# Patient Record
Sex: Female | Born: 1970 | Race: White | Hispanic: No | Marital: Married | Smoking: Never smoker
Health system: Southern US, Community
[De-identification: ages and names within clinical notes are randomized; demographics above are authoritative.]

## PROBLEM LIST (undated history)

## (undated) DIAGNOSIS — E079 Disorder of thyroid, unspecified: Secondary | ICD-10-CM

## (undated) DIAGNOSIS — N979 Female infertility, unspecified: Secondary | ICD-10-CM

## (undated) DIAGNOSIS — M8580 Other specified disorders of bone density and structure, unspecified site: Secondary | ICD-10-CM

## (undated) DIAGNOSIS — K52832 Lymphocytic colitis: Secondary | ICD-10-CM

## (undated) DIAGNOSIS — F419 Anxiety disorder, unspecified: Secondary | ICD-10-CM

## (undated) DIAGNOSIS — F32A Depression, unspecified: Secondary | ICD-10-CM

## (undated) HISTORY — DX: Lymphocytic colitis: K52.832

## (undated) HISTORY — DX: Anxiety disorder, unspecified: F41.9

## (undated) HISTORY — DX: Female infertility, unspecified: N97.9

## (undated) HISTORY — DX: Disorder of thyroid, unspecified: E07.9

## (undated) HISTORY — DX: Depression, unspecified: F32.A

## (undated) HISTORY — DX: Other specified disorders of bone density and structure, unspecified site: M85.80

---

## 2000-05-17 ENCOUNTER — Other Ambulatory Visit: Admission: RE | Admit: 2000-05-17 | Discharge: 2000-05-17 | Payer: Self-pay | Admitting: *Deleted

## 2001-09-20 ENCOUNTER — Other Ambulatory Visit: Admission: RE | Admit: 2001-09-20 | Discharge: 2001-09-20 | Payer: Self-pay | Admitting: Obstetrics and Gynecology

## 2004-05-13 ENCOUNTER — Other Ambulatory Visit: Admission: RE | Admit: 2004-05-13 | Discharge: 2004-05-13 | Payer: Self-pay | Admitting: Obstetrics and Gynecology

## 2004-12-15 ENCOUNTER — Emergency Department (HOSPITAL_COMMUNITY): Admission: EM | Admit: 2004-12-15 | Discharge: 2004-12-15 | Payer: Self-pay | Admitting: Emergency Medicine

## 2005-05-20 ENCOUNTER — Other Ambulatory Visit: Admission: RE | Admit: 2005-05-20 | Discharge: 2005-05-20 | Payer: Self-pay | Admitting: Obstetrics and Gynecology

## 2005-05-26 ENCOUNTER — Encounter: Admission: RE | Admit: 2005-05-26 | Discharge: 2005-05-26 | Payer: Self-pay | Admitting: Obstetrics and Gynecology

## 2006-05-06 ENCOUNTER — Ambulatory Visit (HOSPITAL_COMMUNITY): Admission: RE | Admit: 2006-05-06 | Discharge: 2006-05-06 | Payer: Self-pay | Admitting: Obstetrics & Gynecology

## 2007-01-25 ENCOUNTER — Ambulatory Visit (HOSPITAL_COMMUNITY): Admission: RE | Admit: 2007-01-25 | Discharge: 2007-01-25 | Payer: Self-pay | Admitting: Gynecology

## 2007-05-30 ENCOUNTER — Inpatient Hospital Stay (HOSPITAL_COMMUNITY): Admission: AD | Admit: 2007-05-30 | Discharge: 2007-05-30 | Payer: Self-pay | Admitting: Obstetrics and Gynecology

## 2007-10-20 ENCOUNTER — Inpatient Hospital Stay (HOSPITAL_COMMUNITY): Admission: AD | Admit: 2007-10-20 | Discharge: 2007-10-20 | Payer: Self-pay | Admitting: Obstetrics and Gynecology

## 2007-10-26 ENCOUNTER — Inpatient Hospital Stay (HOSPITAL_COMMUNITY): Admission: AD | Admit: 2007-10-26 | Discharge: 2007-10-28 | Payer: Self-pay | Admitting: Obstetrics and Gynecology

## 2010-03-06 ENCOUNTER — Inpatient Hospital Stay (HOSPITAL_COMMUNITY): Admission: AD | Admit: 2010-03-06 | Discharge: 2010-03-06 | Payer: Self-pay | Admitting: Obstetrics and Gynecology

## 2010-03-17 ENCOUNTER — Inpatient Hospital Stay (HOSPITAL_COMMUNITY)
Admission: AD | Admit: 2010-03-17 | Discharge: 2010-03-17 | Payer: Self-pay | Source: Home / Self Care | Admitting: Obstetrics & Gynecology

## 2010-04-17 ENCOUNTER — Inpatient Hospital Stay (HOSPITAL_COMMUNITY): Admission: AD | Admit: 2010-04-17 | Discharge: 2010-04-19 | Payer: Self-pay | Admitting: Obstetrics and Gynecology

## 2010-04-19 ENCOUNTER — Encounter: Admission: RE | Admit: 2010-04-19 | Discharge: 2010-05-19 | Payer: Self-pay | Admitting: Obstetrics and Gynecology

## 2010-05-20 ENCOUNTER — Encounter: Admission: RE | Admit: 2010-05-20 | Discharge: 2010-06-19 | Payer: Self-pay | Admitting: Obstetrics and Gynecology

## 2010-06-20 ENCOUNTER — Encounter: Admission: RE | Admit: 2010-06-20 | Discharge: 2010-07-20 | Payer: Self-pay | Admitting: Obstetrics and Gynecology

## 2010-07-21 ENCOUNTER — Encounter: Admission: RE | Admit: 2010-07-21 | Discharge: 2010-08-18 | Payer: Self-pay | Admitting: Obstetrics and Gynecology

## 2010-08-21 ENCOUNTER — Encounter
Admission: RE | Admit: 2010-08-21 | Discharge: 2010-09-20 | Payer: Self-pay | Source: Home / Self Care | Admitting: Obstetrics and Gynecology

## 2010-09-21 ENCOUNTER — Encounter
Admission: RE | Admit: 2010-09-21 | Discharge: 2010-10-21 | Payer: Self-pay | Source: Home / Self Care | Admitting: Obstetrics and Gynecology

## 2011-02-08 LAB — CBC
HCT: 31.3 % — ABNORMAL LOW (ref 36.0–46.0)
HCT: 36.2 % (ref 36.0–46.0)
Hemoglobin: 10.9 g/dL — ABNORMAL LOW (ref 12.0–15.0)
Hemoglobin: 12.8 g/dL (ref 12.0–15.0)
MCHC: 35 g/dL (ref 30.0–36.0)
MCHC: 35.5 g/dL (ref 30.0–36.0)
Platelets: 205 10*3/uL (ref 150–400)
RBC: 3.41 MIL/uL — ABNORMAL LOW (ref 3.87–5.11)
RDW: 13.6 % (ref 11.5–15.5)
WBC: 14.6 10*3/uL — ABNORMAL HIGH (ref 4.0–10.5)

## 2011-08-30 LAB — CBC
MCHC: 35
MCHC: 35.1
Platelets: 249
RBC: 3.34 — ABNORMAL LOW
RBC: 4.29
RDW: 13.3
RDW: 13.3

## 2011-08-30 LAB — RPR: RPR Ser Ql: NONREACTIVE

## 2011-08-31 LAB — URINALYSIS, DIPSTICK ONLY
Bilirubin Urine: NEGATIVE
Glucose, UA: NEGATIVE
Leukocytes, UA: NEGATIVE
Nitrite: NEGATIVE
Urobilinogen, UA: 0.2

## 2011-09-07 LAB — URINALYSIS, ROUTINE W REFLEX MICROSCOPIC
Bilirubin Urine: NEGATIVE
Glucose, UA: NEGATIVE
Protein, ur: NEGATIVE
Specific Gravity, Urine: <1.005 — ABNORMAL LOW
pH: 6

## 2015-04-04 ENCOUNTER — Ambulatory Visit (INDEPENDENT_AMBULATORY_CARE_PROVIDER_SITE_OTHER): Payer: BC Managed Care – PPO

## 2015-04-04 ENCOUNTER — Ambulatory Visit (INDEPENDENT_AMBULATORY_CARE_PROVIDER_SITE_OTHER): Payer: BC Managed Care – PPO | Admitting: Family Medicine

## 2015-04-04 VITALS — BP 124/76 | HR 78 | Temp 98.2°F | Resp 17 | Ht 64.5 in | Wt 133.0 lb

## 2015-04-04 DIAGNOSIS — M79672 Pain in left foot: Secondary | ICD-10-CM | POA: Diagnosis not present

## 2015-04-04 DIAGNOSIS — S92355A Nondisplaced fracture of fifth metatarsal bone, left foot, initial encounter for closed fracture: Secondary | ICD-10-CM

## 2015-04-04 MED ORDER — HYDROCODONE-ACETAMINOPHEN 5-325 MG PO TABS: 1.0000 | ORAL_TABLET | Freq: Three times a day (TID) | ORAL | Status: DC | PRN

## 2015-04-04 NOTE — Progress Notes (Signed)
[facility] [address], [place] [id number] [phone]  Date:  04/04/2015   Name:  Sharon English   DOB:  May 22, 1971   MRN:  [id number]  PCP:  No primary care provider on file.    Chief Complaint: Foot Pain   History of Present Illness:  Sharon English is a 44 y.o. very pleasant female patient who presents with the following:  Here today as a new patient. She stood up yesterday after sitting for a long time- did not realize that her left foot was numb from being sat on, the foot rolled under her and inverted.  She had pain and "a wave of nausea."  She noted onset of swelling right away, and it bruised overnight. Here today to make sure no fracture She is wearing a tall CAM that she had at home and is using a crutch that she had.  She thinks she has the other crutch in her garage Hurts to bear weight even with these measures She is generally in good health  LMP was approx 25 days ago- she is just starting the inactive OCP in her current pack  There are no active problems to display for this patient.   No past medical history on file.  No past surgical history on file.  History  Substance Use Topics  . Smoking status: Never Smoker   . Smokeless tobacco: Not on file  . Alcohol Use: Not on file    No family history on file.  Allergies  Allergen Reactions  . Penicillins Rash  . Sulfa Antibiotics Rash    Medication list has been reviewed and updated.  No current outpatient prescriptions on file prior to visit.   No current facility-administered medications on file prior to visit.    Review of Systems:  As per HPI- otherwise negative.   Physical Examination: Filed Vitals:   04/04/15 0905  BP: 124/76  Pulse: 78  Temp: 98.2 F (36.8 C)  Resp: 17   Filed Vitals:   04/04/15 0905  Height: 5' 4.5" (1.638 m)  Weight: 133 lb (60.328 kg)   Body mass index is 22.48 kg/(m^2). Ideal Body Weight: Weight in (lb) to have BMI = 25:  147.6  GEN: WDWN, NAD, Non-toxic, A & O x 3, looks well HEENT: Atraumatic, Normocephalic. Neck supple. No masses, No LAD. Ears and Nose: No external deformity. CV: RRR, No M/G/R. No JVD. No thrill. No extra heart sounds. PULM: CTA B, no wheezes, crackles, rhonchi. No retractions. No resp. distress. No accessory muscle use. EXTR: No c/c/e NEURO using crutches and tall CAM boot to ambulate PSYCH: Normally interactive. Conversant. Not depressed or anxious appearing.  Calm demeanor.  Left foot: bruising and swelling over lateral foot.  Ankle is negative, tibia negative, achilles in intact.  Tender over the proximal 5th MT  [facility] reading (PRIMARY) by  Dr. Patsy Lageropland. Left foot:  Non- displaced 5th MT base fracture  LEFT FOOT - COMPLETE 3+ VIEW  COMPARISON: None.  FINDINGS: Fractured base of left fifth metatarsal noted. Minimal displacement. No other focal abnormality. No radiopaque foreign body.  IMPRESSION: Minimally displaced fracture of the base of the left fifth metatarsal. Assessment and Plan: Nondisplaced fracture of fifth left metatarsal bone, closed, initial encounter - Plan: Ambulatory referral to Orthopedic Surgery  Acute pain of left foot - Plan: DG Foot Complete Left, HYDROcodone-acetaminophen (NORCO/VICODIN) 5-325 MG per tablet   She has crutches at home,  Placed in a short CAM boot  Referral to ortho for follow-up Hydrocodone if needed for more severe pain- she hopes she will not need to use this  Signed Abbe AmsterdamJessica Marjorie Lussier, MD

## 2015-04-04 NOTE — Patient Instructions (Addendum)
You have a firth metatarsal fracture (break of the fifth foot bone).  I think that this will heal well. I will refer you to orthopedics to follow-up.  Elevate and ice your foot, and certainly use ibuprofen/ tylenol as needed You can use the hydrocodone if needed for more severe pain.  Use the boot and crutches to help yourself walk- do not bear weight on the foot when it is painful!   Please let us know if you do not hear about your orthopedics appointment or if you have any other concerns.

## 2015-12-18 ENCOUNTER — Ambulatory Visit
Admission: RE | Admit: 2015-12-18 | Discharge: 2015-12-18 | Disposition: A | Discharge status: Home / Self Care | Payer: BC Managed Care – PPO | Source: Ambulatory Visit | Attending: Chiropractic Medicine | Admitting: Chiropractic Medicine

## 2015-12-18 ENCOUNTER — Other Ambulatory Visit: Payer: Self-pay | Admitting: Chiropractic Medicine

## 2015-12-18 DIAGNOSIS — W19XXXA Unspecified fall, initial encounter: Secondary | ICD-10-CM

## 2015-12-19 ENCOUNTER — Ambulatory Visit
Admission: RE | Admit: 2015-12-19 | Discharge: 2015-12-19 | Disposition: A | Discharge status: Home / Self Care | Payer: BC Managed Care – PPO | Source: Ambulatory Visit | Attending: Chiropractic Medicine | Admitting: Chiropractic Medicine

## 2015-12-19 ENCOUNTER — Other Ambulatory Visit: Payer: Self-pay | Admitting: Chiropractic Medicine

## 2015-12-19 DIAGNOSIS — M25552 Pain in left hip: Secondary | ICD-10-CM

## 2016-02-27 LAB — HM MAMMOGRAPHY

## 2017-02-11 ENCOUNTER — Other Ambulatory Visit: Payer: Self-pay | Admitting: Obstetrics and Gynecology

## 2017-02-11 DIAGNOSIS — N6320 Unspecified lump in the left breast, unspecified quadrant: Secondary | ICD-10-CM

## 2017-03-18 LAB — HM MAMMOGRAPHY

## 2017-05-22 IMAGING — CR DG HIP (WITH OR WITHOUT PELVIS) 2-3V*L*
2 series · 2 of 2 positions shown · non-contrast
Comparison: None.

CLINICAL DATA: Sledding accident 3 weeks ago. Persistent back pain
and left hip pain.

EXAM:
LUMBAR SPINE - COMPLETE 4+ VIEW; DG HIP (WITH OR WITHOUT PELVIS)
2-3V LEFT

[w hip ap left]
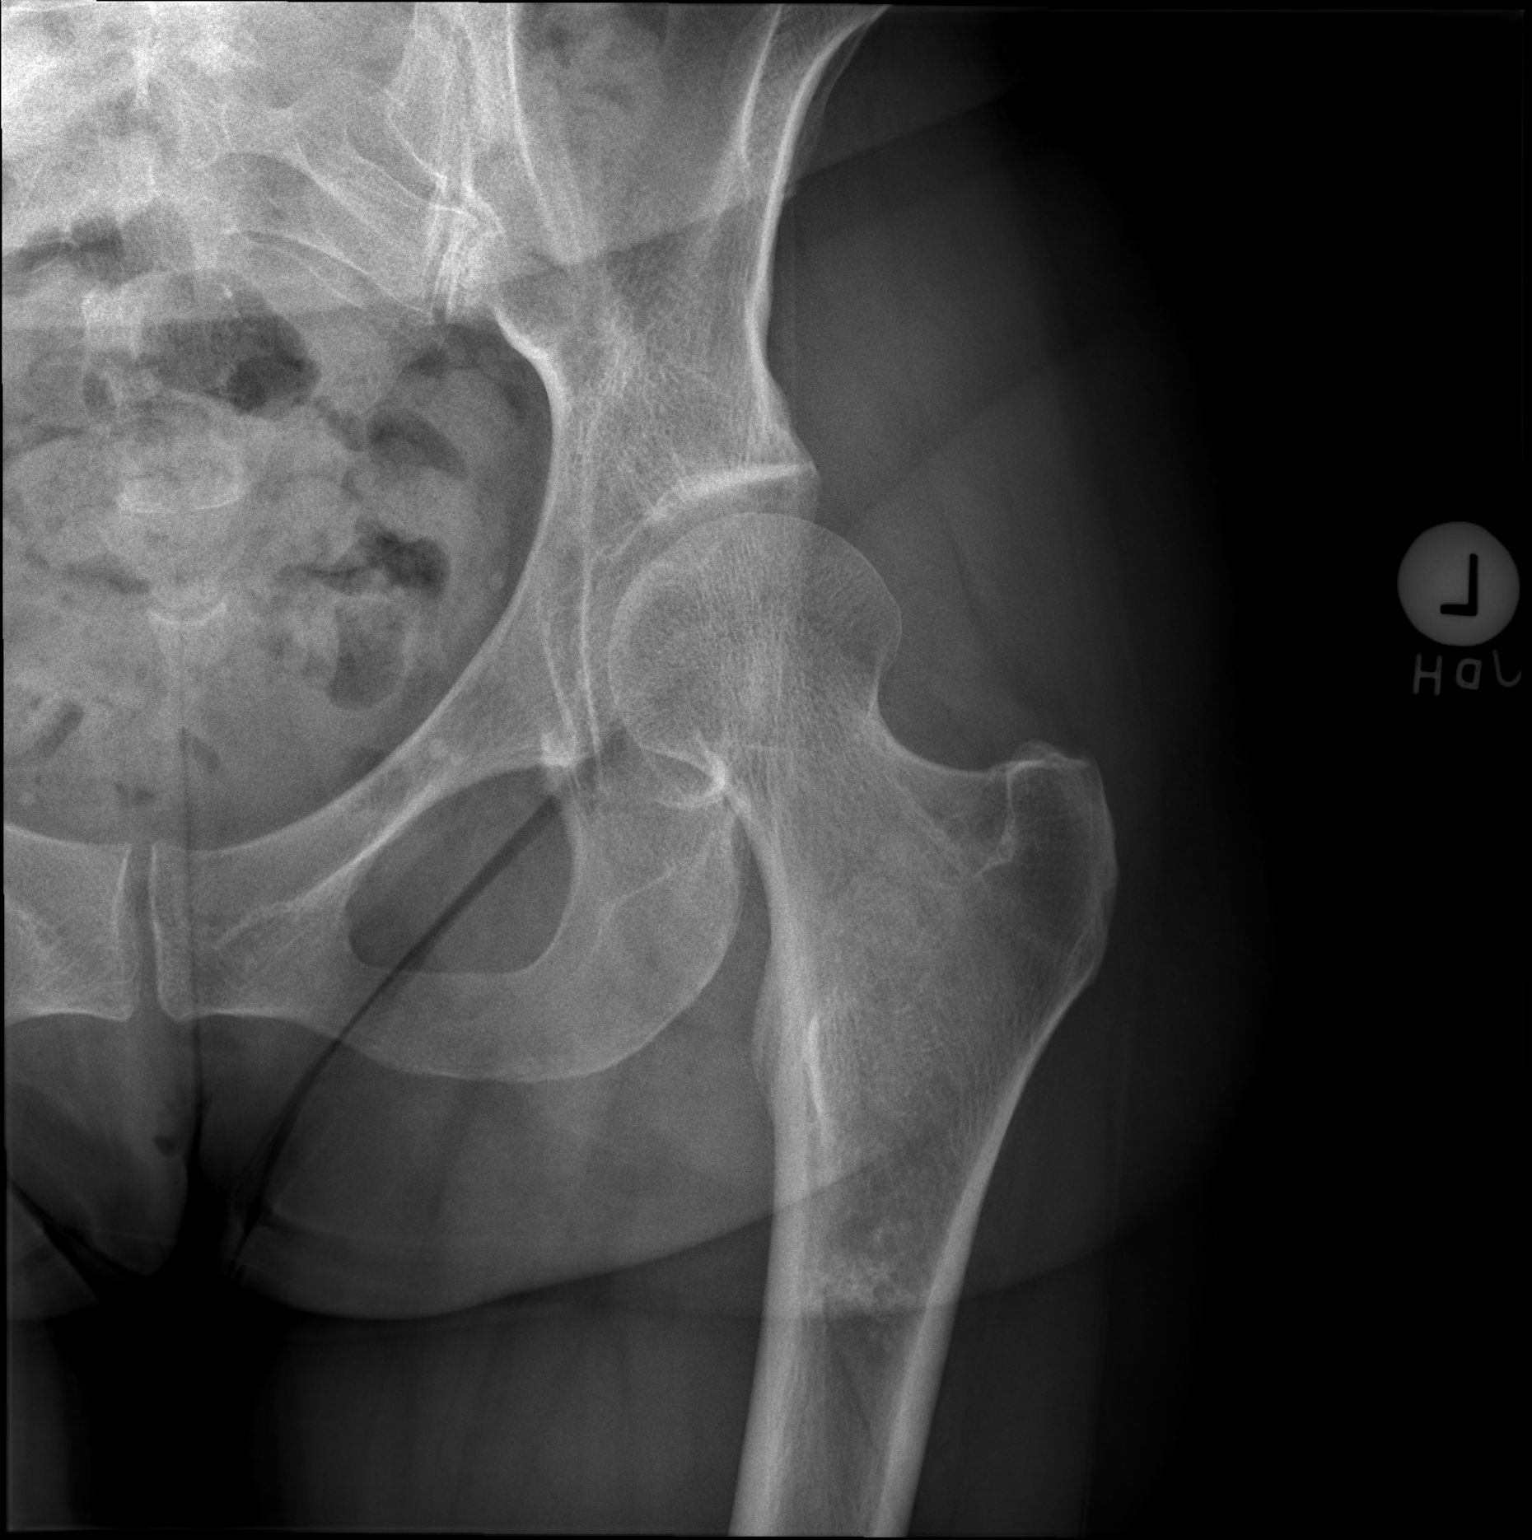

[w hip lat left]
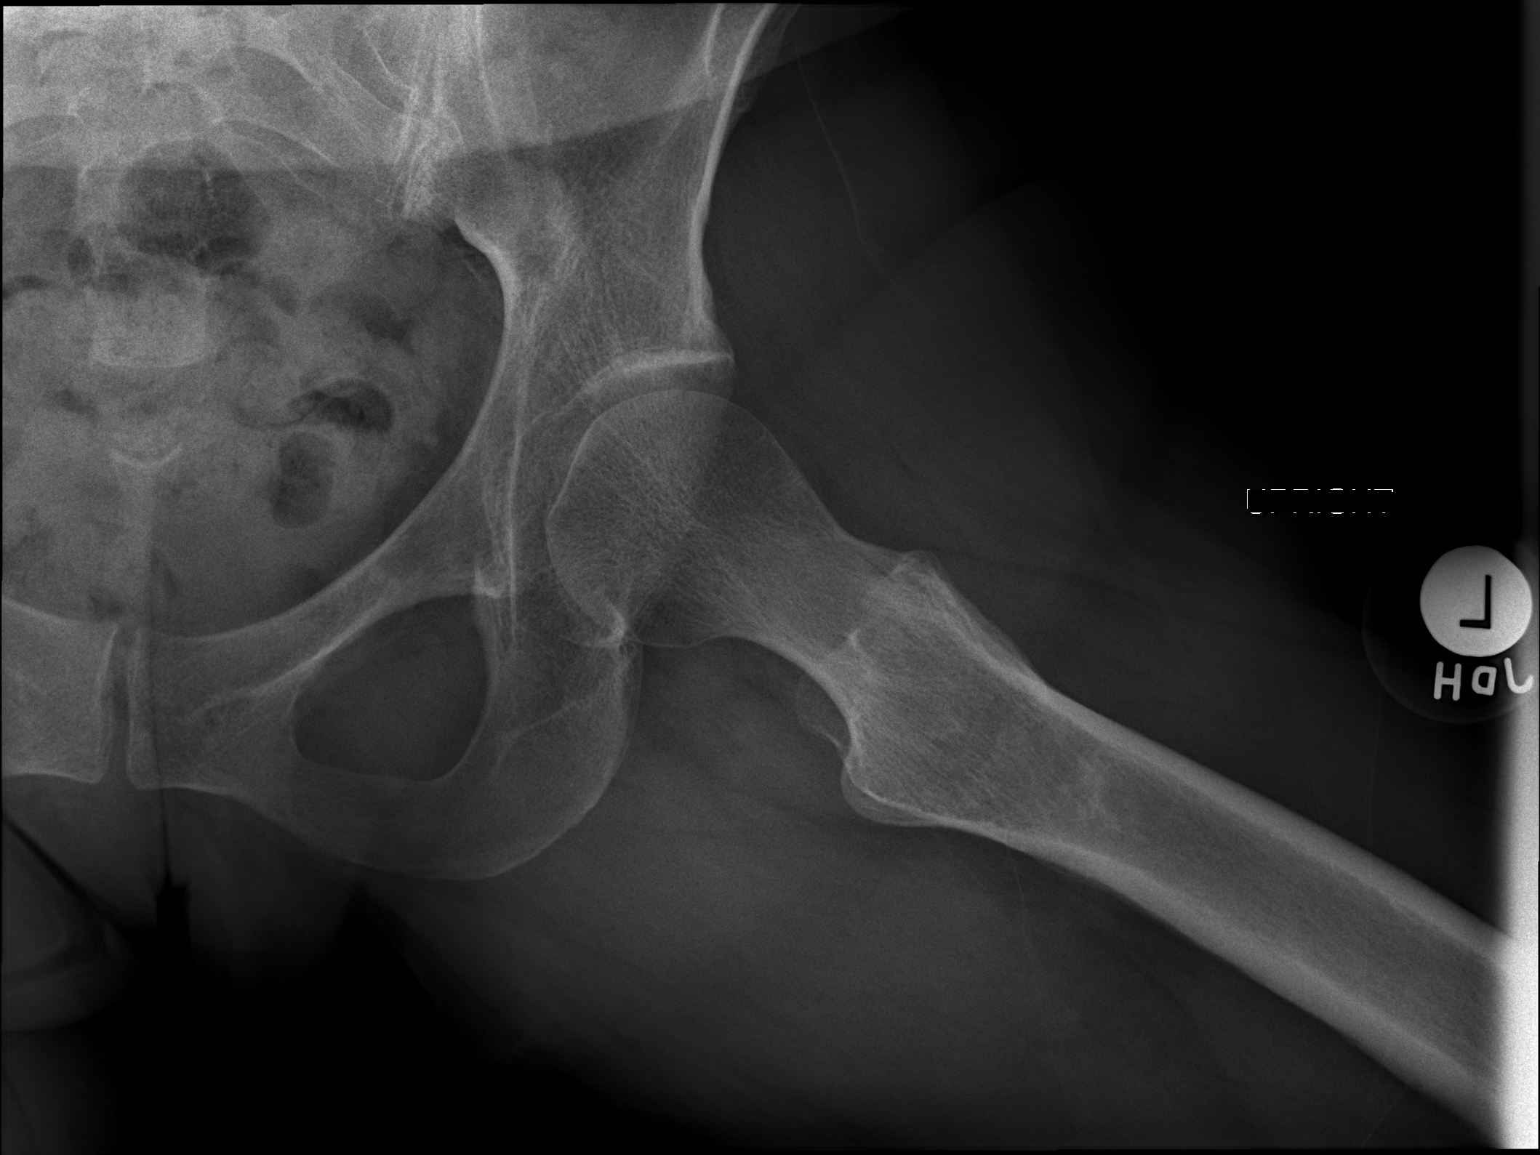

[2 of 2 positions shown; findings below may reference images not displayed]

FINDINGS: Lumbar spine:

Normal alignment of the lumbar vertebral bodies. Disc spaces and
vertebral bodies are maintained. The facets are normally aligned. No
pars defects. The visualized bony pelvis is intact.

Left hip:

The left hip is normally located. No acute fracture or plain film
evidence of avascular necrosis. The pubic symphysis and left SI
joint appear normal. Small faintly sclerotic lesion in the
subtrochanteric region of the left femur is likely a small bone
infarct or possibly enchondroma. There is a linear lucent line
traversing the upper femoral shaft on the frontal film which I do
not see on the lateral film. I think it is unlikely this is a
fracture but dedicated femur views may be helpful.
IMPRESSION: 1. Normal alignment of the lumbar vertebral bodies and no acute
fracture.
2. Linear lucent line traversing the upper femoral shaft on the
frontal film only. This is unlikely a fracture but dedicated femur
diffuse may be helpful for further evaluation. No hip fracture.

## 2018-07-21 LAB — HM MAMMOGRAPHY

## 2019-08-31 LAB — HM MAMMOGRAPHY

## 2020-10-29 LAB — HM MAMMOGRAPHY

## 2020-12-05 ENCOUNTER — Ambulatory Visit (INDEPENDENT_AMBULATORY_CARE_PROVIDER_SITE_OTHER): Payer: Managed Care, Other (non HMO)

## 2020-12-05 ENCOUNTER — Other Ambulatory Visit: Payer: Self-pay

## 2020-12-05 DIAGNOSIS — Z23 Encounter for immunization: Secondary | ICD-10-CM

## 2021-02-11 NOTE — Progress Notes (Signed)
Cardiology Office Note:   Date:  02/12/2021  NAME:  Sharon English    MRN: [id number] DOB:  December 07, 1970   PCP:  Clovis Riley, L.August Saucer, MD  Cardiologist:  No primary care provider on file.  Electrophysiologist:  None   Referring MD: Clovis Riley, L.August Saucer, MD   Chief Complaint  Patient presents with  . Chest Pain   History of Present Illness:   Sharon English is a 50 y.o. female with a hx of thyroid disease who is being seen today for the evaluation of chest pain at the request of Clovis Riley, L.August Saucer, MD.  She presents with 2 episodes of chest pain.  She reports she had an episode 2 weeks ago.  Apparently this occurred on Sunday.  She reports he was driving home from church.  She felt pain in the center of her chest that lasted 3 to 5 minutes.  Symptoms were associated without a trigger.  She reports they resolved with rest and relaxation.  She reports she laid down on the improved.  She does report that she was a bit dizzy and lightheaded with it.  She had a similar episode roughly 1 month ago.  This occurred while laying down to go to bed.  She reports that her aunt has died recently.  There has been a lot of stress in the house.  She does suffer from anxiety depression and sees a Veterinary surgeon.  Symptoms appear to be stable.  She reports no increased stress other than the fact that her aunt did die.  She reports she exercises twice weekly.  She walks her dog.  Her blood pressure is elevated 150/92.  She has no history of hypertension.  Apparently she was a bit stressed about this appointment.  She has no medical problems.  Most recent lipid profile shows a total cholesterol of 213, HDL 54, LDL 141, triglycerides 104.  She is not diabetic.  Her brother had a heart attack at 107.  He was a type I diabetic.  He apparently smoked as well and was obese.  Her parents do not have histories of heart disease.  Her mother has hypertension.  Her father has Parkinson's disease.  She is married and has 2 children.  She reports  again her stress level and depression are well controlled.  EKG in office demonstrates sinus rhythm with no acute ischemic changes or evidence of infarction.  She reports her symptoms have not occurred with exertion.  Her above symptoms are rather atypical.  Past Medical History: Past Medical History:  Diagnosis Date  . Thyroid disease    [employer]reesia 12/05/2020    Past Surgical History: History reviewed. No pertinent surgical history.  Current Medications: Current Meds  Medication Sig  . levothyroxine (SYNTHROID) 125 MCG tablet 1 tablet every morning on an empty stomach  . norethindrone-ethinyl estradiol-iron (ESTROSTEP FE,TILIA FE,TRI-LEGEST FE) 1-20/1-30/1-35 MG-MCG tablet Take 1 tablet by mouth daily.  . sertraline (ZOLOFT) 50 MG tablet sertraline 50 mg tablet     Allergies:    Kiwi extract, Penicillins, and Sulfa antibiotics   Social History: Social History   Socioeconomic History  . Marital status: Married    Spouse name: Not on file  . Number of children: 2  . Years of education: Not on file  . Highest education level: Not on file  Occupational History  . 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 Smoking status: Never Smoker  . Smokeless tobacco: Never Used  Substance and Sexual Activity  . Alcohol use:  Never    Alcohol/week: 0.0 standard drinks  . Drug use: Never  . Sexual activity: Not on file  Other Topics Concern  . Not on file  Social History Narrative  . Not on file   Social Determinants of Health   Financial Resource Strain: Not on file  Food Insecurity: Not on file  Transportation Needs: Not on file  Physical Activity: Not on file  Stress: Not on file  Social Connections: Not on file     Family History: The patient's family history includes Heart attack (age of onset: 26) in her brother; Heart failure in her paternal grandfather; Hypertension in her mother; Parkinson's disease in her father.  ROS:   All other ROS reviewed and negative.  Pertinent positives noted in the HPI.     EKGs/Labs/Other Studies Reviewed:   The following studies were personally reviewed by me today:  EKG:  EKG is ordered today.  The ekg ordered today demonstrates normal sinus rhythm heart rate 84 no acute ischemic changes or evidence of infarction, and was personally reviewed by me.   Recent Labs: No results found for requested labs within last 8760 hours.   Recent Lipid Panel No results found for: CHOL, TRIG, HDL, CHOLHDL, VLDL, LDLCALC, LDLDIRECT  Physical Exam:   VS:  BP (!) 150/92 (BP Location: Right Arm, Patient Position: Sitting, Cuff Size: Normal)   Pulse 84   Ht 5\' 4"  (1.626 m)   Wt 153 lb (69.4 kg)   SpO2 99%   BMI 26.26 kg/m    Wt Readings from Last 3 Encounters:  02/12/21 153 lb (69.4 kg)  04/04/15 133 lb (60.3 kg)    General: Well nourished, well developed, in no acute distress Head: Atraumatic, normal size  Eyes: PEERLA, EOMI  Neck: Supple, no JVD Endocrine: No thryomegaly Cardiac: Normal S1, S2; RRR; no murmurs, rubs, or gallops Lungs: Clear to auscultation bilaterally, no wheezing, rhonchi or rales  Abd: Soft, nontender, no hepatomegaly  Ext: No edema, pulses 2+ Musculoskeletal: No deformities, BUE and BLE strength normal and equal Skin: Warm and dry, no rashes   Neuro: Alert and oriented to person, place, time, and situation, CNII-XII grossly intact, no focal deficits  Psych: Normal mood and affect   ASSESSMENT:   Sharon English is a 50 y.o. female who presents for the following: 1. Chest pain, unspecified type     PLAN:   1. Chest pain, unspecified type -Atypical chest pain episode associated with stress.  Symptoms have not recurred.  They do not occur with exercise and are not alleviated by rest.  Her EKG in office is normal.  Her brother did have a heart attack at 40 but he was a type I diabetic and smoked and was obese.  Her parents did not have heart disease.  Overall I feel she is low risk.  Her symptoms are  atypical for cardiac chest pain.  I suspect this is all stress related.  To further reassure her I have recommended an exercise treadmill stress test.  This will likely be normal and reassure her that her heart is normal.  Her cardiovascular examination is normal.  I see no need to expose her to radiation at her young age.  Shared Decision Making/Informed Consent The risks [chest pain, shortness of breath, cardiac arrhythmias, dizziness, blood pressure fluctuations, myocardial infarction, stroke/transient ischemic attack, and life-threatening complications (estimated to be 1 in 10,000)], benefits (risk stratification, diagnosing coronary artery disease, treatment guidance) and alternatives of an exercise tolerance test  were discussed in detail with Sharon English and she agrees to proceed.     Disposition: Return if symptoms worsen or fail to improve.  Medication Adjustments/Labs and Tests Ordered: Current medicines are reviewed at length with the patient today.  Concerns regarding medicines are outlined above.  Orders Placed This Encounter  Procedures  . EXERCISE TOLERANCE TEST (ETT)  . EKG 12-Lead   No orders of the defined types were placed in this encounter.   Patient Instructions  Medication Instructions:  The current medical regimen is effective;  continue present plan and medications.  *If you need a refill on your cardiac medications before your next appointment, please call your pharmacy*   Lab Work: COVID TESTING NEEDED  If you have labs (blood work) drawn today and your tests are completely normal, you will receive your results only by: Marland Kitchen MyChart Message (if you have MyChart) OR . A paper copy in the mail If you have any lab test that is abnormal or we need to change your treatment, we will call you to review the results.   Testing/Procedures: Your physician has requested that you have an exercise tolerance test, this is a screening tool to track your fitness level. This  test evaluates the your exercise capacity by measuring cardiovascular response to exercise, the stress response is induced by exercise (exercise-treadmill).  Graded exercise test is also known as maximal exercise test or stress EKG test  . Please also follow instruction sheet given.    Follow-Up: At [facility], you and your health needs are our priority.  As part of our continuing mission to provide you with exceptional heart care, we have created designated Provider Care Teams.  These Care Teams include your primary Cardiologist (physician) and Advanced Practice Providers (APPs -  Physician Assistants and Nurse Practitioners) who all work together to provide you with the care you need, when you need it.  We recommend signing up for the patient portal called "MyChart".  Sign up information is provided on this After Visit Summary.  MyChart is used to connect with patients for Virtual Visits (Telemedicine).  Patients are able to view lab/test results, encounter notes, upcoming appointments, etc.  Non-urgent messages can be sent to your provider as well.   To learn more about what you can do with MyChart, go to [email or web address].    Your next appointment:   As needed  The format for your next appointment:   In Person  Provider:   Lennie Odor, MD        Signed, Lenna Gilford. Flora Lipps, MD, [facility]  [facility]  [address] [place], [place] [address] [phone]  02/12/2021 5:06 PM

## 2021-02-12 ENCOUNTER — Other Ambulatory Visit: Payer: Self-pay

## 2021-02-12 ENCOUNTER — Ambulatory Visit: Payer: Managed Care, Other (non HMO) | Admitting: Cardiovascular Disease

## 2021-02-12 ENCOUNTER — Encounter: Payer: Self-pay | Admitting: Cardiovascular Disease

## 2021-02-12 VITALS — BP 150/92 | HR 84 | Ht 64.0 in | Wt 153.0 lb

## 2021-02-12 DIAGNOSIS — R079 Chest pain, unspecified: Secondary | ICD-10-CM | POA: Diagnosis not present

## 2021-02-12 NOTE — Patient Instructions (Signed)
Medication Instructions:  The current medical regimen is effective;  continue present plan and medications.  *If you need a refill on your cardiac medications before your next appointment, please call your pharmacy*   Lab Work: COVID TESTING NEEDED  If you have labs (blood work) drawn today and your tests are completely normal, you will receive your results only by: Marland Kitchen MyChart Message (if you have MyChart) OR . A paper copy in the mail If you have any lab test that is abnormal or we need to change your treatment, we will call you to review the results.   Testing/Procedures: Your physician has requested that you have an exercise tolerance test, this is a screening tool to track your fitness level. This test evaluates the your exercise capacity by measuring cardiovascular response to exercise, the stress response is induced by exercise (exercise-treadmill).  Graded exercise test is also known as maximal exercise test or stress EKG test  . Please also follow instruction sheet given.    Follow-Up: At [facility], you and your health needs are our priority.  As part of our continuing mission to provide you with exceptional heart care, we have created designated Provider Care Teams.  These Care Teams include your primary Cardiologist (physician) and Advanced Practice Providers (APPs -  Physician Assistants and Nurse Practitioners) who all work together to provide you with the care you need, when you need it.  We recommend signing up for the patient portal called "MyChart".  Sign up information is provided on this After Visit Summary.  MyChart is used to connect with patients for Virtual Visits (Telemedicine).  Patients are able to view lab/test results, encounter notes, upcoming appointments, etc.  Non-urgent messages can be sent to your provider as well.   To learn more about what you can do with MyChart, go to [email or web address].    Your next appointment:   As needed  The format for  your next appointment:   In Person  Provider:   Lennie Odor, MD

## 2021-02-19 ENCOUNTER — Telehealth (HOSPITAL_COMMUNITY): Payer: Self-pay | Admitting: *Deleted

## 2021-02-19 NOTE — Telephone Encounter (Signed)
Close encounter 

## 2021-02-21 ENCOUNTER — Other Ambulatory Visit (HOSPITAL_COMMUNITY)
Admission: RE | Admit: 2021-02-21 | Discharge: 2021-02-21 | Disposition: A | Discharge status: Home / Self Care | Payer: Managed Care, Other (non HMO) | Source: Ambulatory Visit | Attending: Cardiovascular Disease | Admitting: Cardiovascular Disease

## 2021-02-21 DIAGNOSIS — Z01812 Encounter for preprocedural laboratory examination: Secondary | ICD-10-CM | POA: Diagnosis not present

## 2021-02-21 DIAGNOSIS — Z20822 Contact with and (suspected) exposure to covid-19: Secondary | ICD-10-CM | POA: Diagnosis not present

## 2021-02-21 LAB — SARS CORONAVIRUS 2 (TAT 6-24 HRS): SARS Coronavirus 2: NEGATIVE

## 2021-02-24 ENCOUNTER — Ambulatory Visit (HOSPITAL_COMMUNITY)
Admission: RE | Admit: 2021-02-24 | Discharge: 2021-02-24 | Disposition: A | Discharge status: Home / Self Care | Payer: Managed Care, Other (non HMO) | Source: Ambulatory Visit | Attending: Cardiovascular Disease | Admitting: Cardiovascular Disease

## 2021-02-24 ENCOUNTER — Other Ambulatory Visit: Payer: Self-pay

## 2021-02-24 DIAGNOSIS — R079 Chest pain, unspecified: Secondary | ICD-10-CM

## 2021-02-24 LAB — EXERCISE TOLERANCE TEST
Estimated workload: 10.1 METS
Exercise duration (min): 9 min
Exercise duration (sec): 0 s
MPHR: 172 {beats}/min
Peak HR: 173 {beats}/min
Percent HR: 100 %
Rest HR: 77 {beats}/min

## 2021-12-02 LAB — HM MAMMOGRAPHY

## 2023-01-14 ENCOUNTER — Encounter: Payer: Managed Care, Other (non HMO) | Admitting: Obstetrics & Gynecology

## 2023-02-04 ENCOUNTER — Encounter: Payer: Self-pay | Admitting: Obstetrics & Gynecology

## 2023-02-04 ENCOUNTER — Ambulatory Visit: Payer: Managed Care, Other (non HMO) | Admitting: Obstetrics & Gynecology

## 2023-02-04 ENCOUNTER — Other Ambulatory Visit (HOSPITAL_COMMUNITY)
Admission: RE | Admit: 2023-02-04 | Discharge: 2023-02-04 | Disposition: A | Discharge status: Home / Self Care | Payer: Managed Care, Other (non HMO) | Source: Ambulatory Visit | Attending: Obstetrics & Gynecology | Admitting: Obstetrics & Gynecology

## 2023-02-04 VITALS — BP 118/84 | HR 82 | Ht 63.75 in | Wt 161.0 lb

## 2023-02-04 DIAGNOSIS — M8580 Other specified disorders of bone density and structure, unspecified site: Secondary | ICD-10-CM

## 2023-02-04 DIAGNOSIS — E039 Hypothyroidism, unspecified: Secondary | ICD-10-CM

## 2023-02-04 DIAGNOSIS — Z01419 Encounter for gynecological examination (general) (routine) without abnormal findings: Secondary | ICD-10-CM | POA: Insufficient documentation

## 2023-02-04 DIAGNOSIS — Z79890 Hormone replacement therapy (postmenopausal): Secondary | ICD-10-CM | POA: Diagnosis not present

## 2023-02-04 MED ORDER — FYAVOLV 1-5 MG-MCG PO TABS: 1.0000 | ORAL_TABLET | Freq: Every day | ORAL | 4 refills | Status: AC

## 2023-02-04 MED ORDER — LEVOTHYROXINE SODIUM 100 MCG PO TABS: 100.0000 ug | ORAL_TABLET | Freq: Every day | ORAL | 4 refills | Status: DC

## 2023-02-04 NOTE — Progress Notes (Signed)
Sharon English [birth year] [id number]   History:    52 y.o. G3P2A1L2 Married.  Daughter 6 and Son 66+ yo.  RP:  New patient presenting for annual gyn exam   HPI: Postmenopause, well on Jinteli 1 mg/5 mcg HRT.  No PMB.  Pap normal in 2019.  No h/o abnormal Pap.  Pap reflex today.  Breasts normal.  Mammo Neg 11/2021.  Will schedule at the [facility].  BD Osteopenia in 2016.  Repeat BD at the [facility] now.  Colono 2022.  BMI 27.85.  F/U Fasting Health Labs here.  Synthroid for Hypothyroidism.  Past medical history,surgical history, family history and social history were all reviewed and documented in the EPIC chart.  Gynecologic History No LMP recorded. (Menstrual status: Other).  Obstetric History OB History  Gravida Para Term Preterm AB Living  3 2 2   1 2   SAB IAB Ectopic Multiple Live Births  1            # Outcome Date GA Lbr Len/2nd Weight Sex Delivery Anes PTL Lv  3 SAB           2 Term           1 Term              ROS: A ROS was performed and pertinent positives and negatives are included in the history. GENERAL: No fevers or chills. HEENT: No change in vision, no earache, sore throat or sinus congestion. NECK: No pain or stiffness. CARDIOVASCULAR: No chest pain or pressure. No palpitations. PULMONARY: No shortness of breath, cough or wheeze. GASTROINTESTINAL: No abdominal pain, nausea, vomiting or diarrhea, melena or bright red blood per rectum. GENITOURINARY: No urinary frequency, urgency, hesitancy or dysuria. MUSCULOSKELETAL: No joint or muscle pain, no back pain, no recent trauma. DERMATOLOGIC: No rash, no itching, no lesions. ENDOCRINE: No polyuria, polydipsia, no heat or cold intolerance. No recent change in weight. HEMATOLOGICAL: No anemia or easy bruising or bleeding. NEUROLOGIC: No headache, seizures, numbness, tingling or weakness. PSYCHIATRIC: No depression, no loss of interest in normal activity or change in sleep pattern.     Exam:   BP 118/84   Pulse  82   Ht 5' 3.75" (1.619 m)   Wt 161 lb (73 kg)   SpO2 99%   BMI 27.85 kg/m   Body mass index is 27.85 kg/m.  General appearance : Well developed well nourished female. No acute distress HEENT: Eyes: no retinal hemorrhage or exudates,  Neck supple, trachea midline, no carotid bruits, no thyroidmegaly Lungs: Clear to auscultation, no rhonchi or wheezes, or rib retractions  Heart: Regular rate and rhythm, no murmurs or gallops Breast:Examined in sitting and supine position were symmetrical in appearance, no palpable masses or tenderness,  no skin retraction, no nipple inversion, no nipple discharge, no skin discoloration, no axillary or supraclavicular lymphadenopathy Abdomen: no palpable masses or tenderness, no rebound or guarding Extremities: no edema or skin discoloration or tenderness  Pelvic: Vulva: Normal             Vagina: No gross lesions or discharge  Cervix: No gross lesions or discharge.  Pap reflex done.  Uterus  AV, normal size, shape and consistency, non-tender and mobile  Adnexa  Without masses or tenderness  Anus: Normal   Assessment/Plan:  52 y.o. female for annual exam   1. Encounter for routine gynecological examination with Papanicolaou smear of cervix Postmenopause, well on Jinteli 1 mg/5 mcg HRT.  No PMB.  Pap normal in 2019.  No h/o abnormal Pap.  Pap reflex today.  Breasts normal.  Mammo Neg 11/2021.  Will schedule at the [facility].  BD Osteopenia in 2016.  Repeat BD at the [facility] now.  Colono 2022.  BMI 27.85.  F/U Fasting Health Labs here.  Synthroid for Hypothyroidism. - Cytology - PAP( [place]) - CBC; Future - Comp Met (CMET); Future - TSH; Future - Lipid Profile; Future - Vitamin D (25 hydroxy); Future  2. Postmenopausal hormone replacement therapy Postmenopause, well on Jinteli 1 mg/5 mcg HRT.  No PMB.  3. Osteopenia, unspecified location BD Osteopenia in 2016.  Repeat BD at the [facility] now.   - DG Bone Density; Future  4.  Hypothyroidism, unspecified type Synthroid Brand prescribed.  Will check TSH.  Other orders - Calcium Citrate-Vitamin D (CALCIUM + D PO); Take by mouth. - levothyroxine (SYNTHROID) 100 MCG tablet; Take 1 tablet (100 mcg total) by mouth daily before breakfast. - FYAVOLV 1-5 MG-MCG TABS tablet; Take 1 tablet by mouth daily.   Princess Bruins MD, 3:47 PM

## 2023-02-07 ENCOUNTER — Other Ambulatory Visit: Payer: Self-pay | Admitting: Obstetrics & Gynecology

## 2023-02-07 DIAGNOSIS — Z1231 Encounter for screening mammogram for malignant neoplasm of breast: Secondary | ICD-10-CM

## 2023-02-10 LAB — CYTOLOGY - PAP: Diagnosis: NEGATIVE

## 2023-03-25 ENCOUNTER — Ambulatory Visit
Admission: RE | Admit: 2023-03-25 | Discharge: 2023-03-25 | Disposition: A | Discharge status: Home / Self Care | Payer: Managed Care, Other (non HMO) | Source: Ambulatory Visit | Attending: Obstetrics & Gynecology | Admitting: Obstetrics & Gynecology

## 2023-03-25 DIAGNOSIS — Z1231 Encounter for screening mammogram for malignant neoplasm of breast: Secondary | ICD-10-CM

## 2023-04-13 ENCOUNTER — Other Ambulatory Visit: Payer: Managed Care, Other (non HMO)

## 2023-04-13 DIAGNOSIS — Z01419 Encounter for gynecological examination (general) (routine) without abnormal findings: Secondary | ICD-10-CM

## 2023-04-14 LAB — COMPREHENSIVE METABOLIC PANEL
AG Ratio: 1.5 (calc) (ref 1.0–2.5)
ALT: 18 U/L (ref 6–29)
AST: 16 U/L (ref 10–35)
Albumin: 4 g/dL (ref 3.6–5.1)
Alkaline phosphatase (APISO): 82 U/L (ref 37–153)
BUN: 13 mg/dL (ref 7–25)
CO2: 27 mmol/L (ref 20–32)
Calcium: 8.8 mg/dL (ref 8.6–10.4)
Chloride: 106 mmol/L (ref 98–110)
Creat: 0.73 mg/dL (ref 0.50–1.03)
Globulin: 2.7 g/dL (calc) (ref 1.9–3.7)
Glucose, Bld: 98 mg/dL (ref 65–99)
Potassium: 4.6 mmol/L (ref 3.5–5.3)
Sodium: 140 mmol/L (ref 135–146)
Total Bilirubin: 0.4 mg/dL (ref 0.2–1.2)
Total Protein: 6.7 g/dL (ref 6.1–8.1)

## 2023-04-14 LAB — CBC
HCT: 41.4 % (ref 35.0–45.0)
Hemoglobin: 13.8 g/dL (ref 11.7–15.5)
MCH: 29.6 pg (ref 27.0–33.0)
MCHC: 33.3 g/dL (ref 32.0–36.0)
MCV: 88.7 fL (ref 80.0–100.0)
MPV: 9.8 fL (ref 7.5–12.5)
Platelets: 246 10*3/uL (ref 140–400)
RBC: 4.67 10*6/uL (ref 3.80–5.10)
RDW: 12.3 % (ref 11.0–15.0)
WBC: 5.2 10*3/uL (ref 3.8–10.8)

## 2023-04-14 LAB — LIPID PANEL
Cholesterol: 202 mg/dL — ABNORMAL HIGH (ref <200)
HDL: 59 mg/dL (ref >=50)
LDL Cholesterol (Calc): 124 mg/dL (calc) — ABNORMAL HIGH
Non-HDL Cholesterol (Calc): 143 mg/dL (calc) — ABNORMAL HIGH (ref <130)
Total CHOL/HDL Ratio: 3.4 (calc) (ref <5.0)
Triglycerides: 90 mg/dL (ref <150)

## 2023-04-14 LAB — TSH: TSH: 1.83 mIU/L

## 2023-04-14 LAB — VITAMIN D 25 HYDROXY (VIT D DEFICIENCY, FRACTURES): Vit D, 25-Hydroxy: 30 ng/mL (ref 30–100)

## 2023-05-17 ENCOUNTER — Telehealth: Payer: Self-pay | Admitting: Internal Medicine

## 2023-05-17 NOTE — Telephone Encounter (Signed)
Inbound call from patient requesting to transfer her care over to Dr. Rhea Belton. Has history with Dr. Loreta Ave. States she will have records sent over to [place].

## 2023-05-20 NOTE — Telephone Encounter (Signed)
Called patient to advised we received records and to discuss transfer of care.

## 2023-05-20 NOTE — Telephone Encounter (Signed)
Good Afternoon Dr. Rhea Belton,  We have received records from patients previous gastro provider for patient to transfer her care to you. Patient is needing to be seen for Colitis. Patients records include office visits with Dr. Loreta Ave and can be found under the media tab. Will you please review and advise on scheduling patient?  Thank you.

## 2023-05-21 NOTE — Telephone Encounter (Signed)
Ok for appt  

## 2023-05-25 ENCOUNTER — Encounter: Payer: Self-pay | Admitting: Physician Assistant

## 2023-05-25 NOTE — Telephone Encounter (Signed)
Patient scheduled for 9/18 with a PA

## 2023-08-10 ENCOUNTER — Ambulatory Visit
Admission: RE | Admit: 2023-08-10 | Discharge: 2023-08-10 | Disposition: A | Discharge status: Home / Self Care | Payer: Managed Care, Other (non HMO) | Source: Ambulatory Visit | Attending: Obstetrics & Gynecology | Admitting: Obstetrics & Gynecology

## 2023-08-10 ENCOUNTER — Other Ambulatory Visit: Payer: Self-pay | Admitting: Obstetrics and Gynecology

## 2023-08-10 ENCOUNTER — Ambulatory Visit: Payer: Managed Care, Other (non HMO) | Admitting: Physician Assistant

## 2023-08-10 DIAGNOSIS — M8580 Other specified disorders of bone density and structure, unspecified site: Secondary | ICD-10-CM

## 2023-08-10 DIAGNOSIS — E039 Hypothyroidism, unspecified: Secondary | ICD-10-CM

## 2023-08-10 DIAGNOSIS — Z79890 Hormone replacement therapy (postmenopausal): Secondary | ICD-10-CM

## 2023-08-10 DIAGNOSIS — Z01419 Encounter for gynecological examination (general) (routine) without abnormal findings: Secondary | ICD-10-CM

## 2023-08-16 ENCOUNTER — Telehealth: Payer: Self-pay

## 2023-08-16 DIAGNOSIS — E039 Hypothyroidism, unspecified: Secondary | ICD-10-CM

## 2023-08-16 MED ORDER — LEVOTHYROXINE SODIUM 100 MCG PO TABS: 100.0000 ug | ORAL_TABLET | Freq: Every day | ORAL | 3 refills | Status: DC

## 2023-08-16 NOTE — Telephone Encounter (Signed)
Patient returned call regarding levothyroxine sodium prescription and lab appointment.  Patient stated she transferred her care back to [facility].  Pharmacy contacted and prescription was cancelled for Levothyroxine Sodium .

## 2023-08-16 NOTE — Telephone Encounter (Signed)
Ok for Levothyroxine sodium 100 mcg daily.  #90, RF 3.   I recommend she have a lab visit for thyroid function check in 2 months.  Please schedule this.   I will place future orders for the blood work.

## 2023-08-16 NOTE — Telephone Encounter (Signed)
Per DPR, voicemail left for patient regarding Brand Only Synthroid not being covered by her insurance, new RX sent to her pharmacy and she would need a lab appointment 2 months after starting new prescription.  Message sent to front desk to schedule lab appointment.

## 2023-08-16 NOTE — Telephone Encounter (Signed)
Med refill request: Brand Only Synthroid is not covered by patient's insurance.  Insurance will cover Levothyroxine Sodium, Levoxyl, Unithroid.  Advise if ok for new prescription. Last AEX: 02/04/23 Dr. Seymour Bars

## 2023-10-10 ENCOUNTER — Ambulatory Visit: Payer: Managed Care, Other (non HMO) | Admitting: Physician Assistant

## 2023-10-10 ENCOUNTER — Encounter: Payer: Self-pay | Admitting: Physician Assistant

## 2023-10-10 VITALS — BP 122/68 | HR 80 | Ht 64.0 in | Wt 165.0 lb

## 2023-10-10 DIAGNOSIS — K52832 Lymphocytic colitis: Secondary | ICD-10-CM | POA: Diagnosis not present

## 2023-10-10 NOTE — Patient Instructions (Signed)
_______________________________________________________  If your blood pressure at your visit was 140/90 or greater, please contact your primary care physician to follow up on this.  _______________________________________________________  If you are age 50 or older, your body mass index should be between 23-30. Your Body mass index is 28.32 kg/m. If this is out of the aforementioned range listed, please consider follow up with your Primary Care Provider.  If you are age 70 or younger, your body mass index should be between 19-25. Your Body mass index is 28.32 kg/m. If this is out of the aformentioned range listed, please consider follow up with your Primary Care Provider.   ________________________________________________________  The [facility] providers would like to encourage you to use [facility] to communicate with providers for non-urgent requests or questions.  Due to long hold times on the telephone, sending your provider a message by [facility] may be a faster and more efficient way to get a response.  Please allow 48 business hours for a response.  Please remember that this is for non-urgent requests.  _______________________________________________________  Sharon English will follow up in our office on an as needed basis.  It was a pleasure to see you today!  Hyacinth Meeker, PA-C

## 2023-10-10 NOTE — Progress Notes (Signed)
Chief Complaint: Lymphocytic colitis  HPI:    Mrs.  Sharon English is a 52 year old female with past medical history as listed below including lymphocytic colitis, requesting Dr. Rhea Belton, who presents to clinic today as a transfer from Dr. Loreta Ave for lymphocytic colitis.    03/18/2021 colonoscopy with random colon biopsy showing lymphocytic colitis.    04/16/2021 patient seen in clinic by Dr. Loreta Ave at that time was taking Budesonide 3 mg 3 tabs daily.  The diarrhea had resolved and she was dealing with constipation.  Discussed a family history of colon cancer in her paternal aunt.  That time recommended fiber.  Also told her to stop Budesonide.  Repeated labs including a CBC and CMP.  Repeat colonoscopy recommended in 2032.    04/13/2023 CMP and CBC normal.    Today, patient presents to clinic and tells me she was diagnosed with lymphocytic colitis a couple of years ago by Dr. Loreta Ave.  She took a round of Budesonide and then became constipated so she stopped this medicine.  She had been doing fine until June/July of this year when she tells me she had a change in bowel habits towards constipation with bloating and rectal pressure and a decreased appetite.  She had recently added Oatmeal into her diet in the mornings and feels like this may have caused it.  It was so severe while on vacation that she took a stool softener but this really did not help that much.  When she got back home she started a probiotic and stopped eating her oatmeal and everything is returned to normal.  She does want to establish care here for the future.    Denies fever, chills or weight loss.  Past Medical History:  Diagnosis Date   Anxiety    Depression    hypothyroid    [employer] 12/05/2020   Infertility, female    Lymphocytic colitis    Osteopenia     No past surgical history on file.  Current Outpatient Medications  Medication Sig Dispense Refill   Calcium Citrate-Vitamin D (CALCIUM + D PO) Take by mouth.     FYAVOLV 1-5 MG-MCG  TABS tablet Take 1 tablet by mouth daily. 90 tablet 4   levothyroxine (SYNTHROID) 100 MCG tablet Take 1 tablet (100 mcg total) by mouth daily before breakfast. 90 tablet 4   levothyroxine (SYNTHROID) 100 MCG tablet Take 1 tablet (100 mcg total) by mouth daily before breakfast. 90 tablet 3   No current facility-administered medications for this visit.    Allergies as of 10/10/2023 - Review Complete 02/04/2023  Allergen Reaction Noted   Kiwi extract  02/12/2021   Penicillins Rash 04/04/2015   Sulfa antibiotics Rash 04/04/2015    Family History  Problem Relation Age of Onset   Hypertension Mother    Parkinson's disease Father    Hypertension Brother    Diabetes Brother    Heart attack Brother 68   Colon cancer Paternal Aunt    Multiple myeloma Maternal Grandmother    Stroke Maternal Grandfather        tia   Heart failure Paternal Grandfather     Social History   Socioeconomic History   Marital status: Married    Spouse name: Not on file   Number of children: 2   Years of education: Not on file   Highest education level: Not on file  Occupational History   Occupation: [employer]ist  Tobacco Use   Smoking status: Never   Smokeless tobacco: Never  Substance and Sexual Activity   Alcohol use: Yes    Comment: occ   Drug use: Never   Sexual activity: Yes    Partners: Male    Birth control/protection: None  Other Topics Concern   Not on file  Social History Narrative   Not on file   Social Determinants of Health   Financial Resource Strain: Not on file  Food Insecurity: Not on file  Transportation Needs: Not on file  Physical Activity: Not on file  Stress: Not on file  Social Connections: Not on file  Intimate Partner Violence: Not on file    Review of Systems:    Constitutional: No weight loss, fever or chills Skin: No rash  Cardiovascular: No chest pain Respiratory: No SOB  Gastrointestinal: See HPI and otherwise negative Genitourinary: No  dysuria  Neurological: No headache, dizziness or syncope Musculoskeletal: No new muscle or joint pain Hematologic: No bleeding  Psychiatric: No history of depression or anxiety   Physical Exam:  Vital signs: BP 122/68   Pulse 80   Ht 5\' 4"  (1.626 m)   Wt 165 lb (74.8 kg)   BMI 28.32 kg/m    Constitutional:   Pleasant Caucasian female appears to be in NAD, Well developed, Well nourished, alert and cooperative Head:  Normocephalic and atraumatic. Eyes:   PEERL, EOMI. No icterus. Conjunctiva pink. Ears:  Normal auditory acuity. Neck:  Supple Throat: Oral cavity and pharynx without inflammation, swelling or lesion.  Respiratory: Respirations even and unlabored. Lungs clear to auscultation bilaterally.   No wheezes, crackles, or rhonchi.  Cardiovascular: Normal S1, S2. No MRG. Regular rate and rhythm. No peripheral edema, cyanosis or pallor.  Gastrointestinal:  Soft, nondistended, nontender. No rebound or guarding. Normal bowel sounds. No appreciable masses or hepatomegaly. Rectal:  Not performed.  Msk:  Symmetrical without gross deformities. Without edema, no deformity or joint abnormality.  Neurologic:  Alert and  oriented x4;  grossly normal neurologically.  Skin:   Dry and intact without significant lesions or rashes. Psychiatric: Demonstrates good judgement and reason without abnormal affect or behaviors.  RELEVANT LABS AND IMAGING: CBC    Component Value Date/Time   WBC 5.2 04/13/2023 0842   RBC 4.67 04/13/2023 0842   HGB 13.8 04/13/2023 0842   HCT 41.4 04/13/2023 0842   PLT 246 04/13/2023 0842   MCV 88.7 04/13/2023 0842   MCH 29.6 04/13/2023 0842   MCHC 33.3 04/13/2023 0842   RDW 12.3 04/13/2023 0842    CMP     Component Value Date/Time   NA 140 04/13/2023 0842   K 4.6 04/13/2023 0842   CL 106 04/13/2023 0842   CO2 27 04/13/2023 0842   GLUCOSE 98 04/13/2023 0842   BUN 13 04/13/2023 0842   CREATININE 0.73 04/13/2023 0842   CALCIUM 8.8 04/13/2023 0842   PROT  6.7 04/13/2023 0842   AST 16 04/13/2023 0842   ALT 18 04/13/2023 0842   BILITOT 0.4 04/13/2023 0842    Assessment: 1.  History of lymphocytic colitis: Currently with normal regular bowel movements but was treated with Budesonide back in 2022, did have a bout of constipation after starting oatmeal in her diet back in the summer, but this is resolved now; likely diet related constipation  Plan: 1.  Patient has been accepted by Dr. Rhea Belton. 2.  Currently patient doing well.  Her next screening colonoscopy is recommended in April 2032. 3.  Discussed the diagnosis of lymphocytic colitis and what this means for her.  Discussed pathophysiology  and answered all of her questions. 4.  Patient to follow in clinic with [place] as needed.  Hyacinth Meeker, PA-C [facility] 10/10/2023, 2:22 PM  Cc: Clovis Riley, L.August Saucer, MD

## 2023-10-14 NOTE — Progress Notes (Signed)
Addendum: Reviewed and agree with assessment and management plan. Kadijah Shamoon M, MD  

## 2024-03-18 ENCOUNTER — Other Ambulatory Visit: Payer: Self-pay | Admitting: Obstetrics & Gynecology

## 2024-03-18 DIAGNOSIS — E039 Hypothyroidism, unspecified: Secondary | ICD-10-CM

## 2024-03-19 NOTE — Telephone Encounter (Signed)
 Medication refill request: synthroid  100mcg  Last AEX:  02/04/23 Next AEX: not scheduled  Last MMG (if hormonal medication request): n/a Refill authorized: please advise. Note send to pharmacy for patient to schedule aex.

## 2024-03-21 ENCOUNTER — Telehealth: Payer: Self-pay

## 2024-03-21 NOTE — Telephone Encounter (Signed)
 Prior authorization request has been sent on for Synthroid , insurance won't cover name brand synthroid . Do you want to switch her to generic or do you want the prior authorization completed?  Last aex was 02/04/23 with Dr. Lavoie

## 2024-03-21 NOTE — Telephone Encounter (Signed)
Generic is fine. Thanks
